# Patient Record
Sex: Male | Born: 1995
Health system: Southern US, Community
[De-identification: ages and names within clinical notes are randomized; demographics above are authoritative.]

## PROBLEM LIST (undated history)

## (undated) DIAGNOSIS — J45909 Unspecified asthma, uncomplicated: Secondary | ICD-10-CM

## (undated) HISTORY — PX: EAR TUBE REMOVAL: SHX1486

---
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
## 2017-02-12 DIAGNOSIS — R0781 Pleurodynia: Secondary | ICD-10-CM | POA: Diagnosis not present

## 2017-02-12 DIAGNOSIS — R6884 Jaw pain: Secondary | ICD-10-CM | POA: Diagnosis not present

## 2017-02-12 DIAGNOSIS — S0083XA Contusion of other part of head, initial encounter: Secondary | ICD-10-CM | POA: Diagnosis not present

## 2017-02-12 DIAGNOSIS — R0789 Other chest pain: Secondary | ICD-10-CM | POA: Diagnosis not present

## 2017-02-12 DIAGNOSIS — M546 Pain in thoracic spine: Secondary | ICD-10-CM | POA: Diagnosis not present

## 2017-06-15 ENCOUNTER — Telehealth: Payer: Self-pay | Admitting: Osteopathic Medicine

## 2017-06-15 DIAGNOSIS — Z0189 Encounter for other specified special examinations: Secondary | ICD-10-CM

## 2017-06-15 DIAGNOSIS — Z419 Encounter for procedure for purposes other than remedying health state, unspecified: Secondary | ICD-10-CM

## 2017-06-15 NOTE — Telephone Encounter (Signed)
Please call patient/dad [phone] I received form for clearance for school. For physical exam, I used the documentation from our annual physical back in January. However, the paperwork requires a vision and hearing screen, as well as a urine test and a blood test for anemia. He can go to the lab for urine and blood test, these orders are in. The paperwork also mentions immunization requirements listed on page 3 which I do not have available to review. I don't have any immunization records on file for him. Please ask patient to schedule an office visit with me and bring immunization records and full paperwork before I can sign off on this.

## 2017-06-19 NOTE — Telephone Encounter (Signed)
Informed pt's mother of recommendations. She asked that an appointment be made for pt to complete forms. Advised that they will need to bring in his immunization records so that we can complete his forms. She voiced understanding and I made the appt.Loralee PacasBarkley, Melvina Pangelinan JavaLynetta

## 2017-06-21 ENCOUNTER — Encounter: Payer: Self-pay | Admitting: Osteopathic Medicine

## 2017-06-21 ENCOUNTER — Ambulatory Visit (INDEPENDENT_AMBULATORY_CARE_PROVIDER_SITE_OTHER): Payer: 59 | Admitting: Osteopathic Medicine

## 2017-06-21 VITALS — BP 139/86 | HR 68 | Wt 186.0 lb

## 2017-06-21 DIAGNOSIS — Z020 Encounter for examination for admission to educational institution: Secondary | ICD-10-CM | POA: Diagnosis not present

## 2017-06-21 DIAGNOSIS — Z111 Encounter for screening for respiratory tuberculosis: Secondary | ICD-10-CM

## 2017-06-21 DIAGNOSIS — R2231 Localized swelling, mass and lump, right upper limb: Secondary | ICD-10-CM

## 2017-06-21 LAB — POCT HEMOGLOBIN: Hemoglobin: 14.7 g/dL (ref 14.1–18.1)

## 2017-06-21 LAB — POCT URINALYSIS DIPSTICK
BILIRUBIN UA: NEGATIVE
Blood, UA: NEGATIVE
Glucose, UA: NEGATIVE
KETONES UA: NEGATIVE
LEUKOCYTES UA: NEGATIVE
Nitrite, UA: NEGATIVE
PROTEIN UA: NEGATIVE
Spec Grav, UA: >=1.03 — AB (ref 1.010–1.025)
Urobilinogen, UA: 0.2 E.U./dL
pH, UA: 6.5 (ref 5.0–8.0)

## 2017-06-21 NOTE — Patient Instructions (Signed)
Plan:  Check TB test in 48 hours at Urgent Care  I will review all results and complete forms on Monday - will leave these up front for you to come pick up later in the week   If any concerns requiring further testing, we will call you  Any questions, please let us know!

## 2017-06-21 NOTE — Progress Notes (Signed)
HPI: Anthony Hurley is a 21 y.o. male  who presents to [facility] today, 06/21/17,  for chief complaint of:  Chief Complaint  Patient presents with  . Needs forms completed for school    Doing well. Needs forms completed for school including hearing/vision screen, review immunizations, Hgb level, PPD testing.   Concerned about small lump in R axilla present a few days  Past medical history, surgical history, social history and family history reviewed.  There are no active problems to display for this patient.  No family history on file.  Social History   Social History  . Marital status: Single    Spouse name: N/A  . Number of children: N/A  . Years of education: N/A   Occupational History  . Not on file.   Social History Main Topics  . Smoking status: Never Smoker  . Smokeless tobacco: Never Used  . Alcohol use No  . Drug use: No  . Sexual activity: Not on file   Other Topics Concern  . Not on file   Social History Narrative  . No narrative on file   Current medication list and allergy/intolerance information reviewed.   No current outpatient prescriptions on file prior to visit.   No current facility-administered medications on file prior to visit.    No Known Allergies    Review of Systems:  Constitutional: No recent illness  HEENT: No  headache, no vision change  Cardiac: No  chest pain, No  pressure, No palpitations  Respiratory:  No  shortness of breath. No  Cough  Gastrointestinal: No  abdominal pain, no change on bowel habits  Musculoskeletal: No new myalgia/arthralgia  Skin: No  Rash  Hem/Onc: No  easy bruising/bleeding, No  abnormal lumps/bumps  Neurologic: No  weakness, No  Dizziness  Psychiatric: No  concerns with depression, No  concerns with anxiety  Exam:  BP 139/86   Pulse 68   Wt 186 lb (84.4 kg)   SpO2 100%   BMI 30.02 kg/m   Constitutional: VS see above. General Appearance: alert,  well-developed, well-nourished, NAD  Eyes: Normal lids and conjunctive, non-icteric sclera  Ears, Nose, Mouth, Throat: MMM, Normal external inspection ears/nares/mouth/lips/gums.  Neck: No masses, trachea midline.   Respiratory: Normal respiratory effort. no wheeze, no rhonchi, no rales  Cardiovascular: S1/S2 normal, no murmur, no rub/gallop auscultated. RRR.   Musculoskeletal: Gait normal. Symmetric and independent movement of all extremities  Neurological: Normal balance/coordination. No tremor.  Skin: warm, dry, intact. Small LN vs cyst R axilla, nontender, no erythema  Psychiatric: Normal judgment/insight. Normal mood and affect. Oriented x3.   Results for orders placed or performed in visit on 06/21/17 (from the past 24 hour(s))  POCT urinalysis dipstick     Status: Abnormal   Collection Time: 06/21/17  2:35 PM  Result Value Ref Range   Color, UA yellow    Clarity, UA clear    Glucose, UA neg    Bilirubin, UA neg    Ketones, UA neg    Spec Grav, UA >=1.030 (A) 1.010 - 1.025   Blood, UA neg    pH, UA 6.5 5.0 - 8.0   Protein, UA neg    Urobilinogen, UA 0.2 0.2 or 1.0 E.U./dL   Nitrite, UA neg    Leukocytes, UA Negative Negative  POCT hemoglobin     Status: None   Collection Time: 06/21/17  2:35 PM  Result Value Ref Range   Hemoglobin 14.7 14.1 - 18.1  g/dL      ASSESSMENT/PLAN:   School physical exam - No concerns on available labs. Immunizations UTD. PPD pending. Hgb and UA pending.  - Plan: POCT urinalysis dipstick, POCT hemoglobin  Mass of right axilla - small pea-szed mobile subq palpable, c/w LN vs cyst, watchful waiting  Encounter for PPD test - Plan: PPD    Patient Instructions  Plan:  Check TB test in 48 hours at Urgent Care  I will review all results and complete forms on Monday - will leave these up front for you to come pick up later in the week   If any concerns requiring further testing, we will call you  Any questions, please let us know!      Follow-up plan: Return in about 2 days (around 06/23/2017) for TB test interpretation by nurse at urgent care next door .  Visit summary with medication list and pertinent instructions was printed for patient to review, alert us if any changes needed. All questions at time of visit were answered - patient instructed to contact office with any additional concerns. ER/RTC precautions were reviewed with the patient and understanding verbalized.   Total time spent 25 minutes, greater than 50% of the visit was face to face counseling and coordinating care for diagnosis of The primary encounter diagnosis was School physical exam. Diagnoses of Mass of right axilla and Encounter for PPD test were also pertinent to this visit.

## 2017-06-23 ENCOUNTER — Emergency Department (INDEPENDENT_AMBULATORY_CARE_PROVIDER_SITE_OTHER)
Admission: EM | Admit: 2017-06-23 | Discharge: 2017-06-23 | Disposition: A | Discharge status: Home / Self Care | Payer: 59 | Source: Home / Self Care

## 2017-06-23 DIAGNOSIS — Z111 Encounter for screening for respiratory tuberculosis: Secondary | ICD-10-CM

## 2017-06-23 DIAGNOSIS — Z020 Encounter for examination for admission to educational institution: Secondary | ICD-10-CM | POA: Diagnosis not present

## 2017-06-23 NOTE — ED Triage Notes (Signed)
Pt here for ppd reading. Had placed in left forearm on 06/21/17 at primary care. negative ppd. No induration.

## 2017-06-29 ENCOUNTER — Telehealth: Payer: Self-pay | Admitting: Osteopathic Medicine

## 2017-06-29 NOTE — Telephone Encounter (Signed)
Patient's dad advised.  

## 2017-06-29 NOTE — Telephone Encounter (Signed)
School forms signed, placed in assistant's in basket. Sticking out on here says called dad when complete, [name] [phone]. Thanks.

## 2017-08-20 ENCOUNTER — Ambulatory Visit (INDEPENDENT_AMBULATORY_CARE_PROVIDER_SITE_OTHER): Payer: 59 | Admitting: Osteopathic Medicine

## 2017-08-20 ENCOUNTER — Encounter: Payer: Self-pay | Admitting: Osteopathic Medicine

## 2017-08-20 VITALS — BP 129/77 | HR 62 | Temp 98.7°F | Ht 66.0 in | Wt 181.0 lb

## 2017-08-20 DIAGNOSIS — R002 Palpitations: Secondary | ICD-10-CM | POA: Diagnosis not present

## 2017-08-20 DIAGNOSIS — S29012A Strain of muscle and tendon of back wall of thorax, initial encounter: Secondary | ICD-10-CM | POA: Diagnosis not present

## 2017-08-20 DIAGNOSIS — Z23 Encounter for immunization: Secondary | ICD-10-CM

## 2017-08-20 MED ORDER — METHYLPREDNISOLONE 4 MG PO TBPK: ORAL_TABLET | ORAL | 0 refills | Status: DC

## 2017-08-20 MED ORDER — CYCLOBENZAPRINE HCL 10 MG PO TABS: 5.0000 mg | ORAL_TABLET | Freq: Three times a day (TID) | ORAL | 1 refills | Status: DC | PRN

## 2017-08-20 MED ORDER — NAPROXEN 500 MG PO TABS: 500.0000 mg | ORAL_TABLET | Freq: Two times a day (BID) | ORAL | 1 refills | Status: DC

## 2017-08-20 MED FILL — CYCLOBENZAPRINE 10 MG TAB: 10 | 10 days supply | Qty: 30 | Fill #0

## 2017-08-20 MED FILL — NAPROXEN 500 MG TABLET: 500 | 15 days supply | Qty: 30 | Fill #0

## 2017-08-20 MED FILL — METHYLPREDNISOLONE 4 MG TAB: 4 | 6 days supply | Qty: 21 | Fill #0

## 2017-08-20 NOTE — Progress Notes (Signed)
HPI: Anthony Hurley is a 21 y.o. male  who presents to [facility] today, 08/20/17,  for chief complaint of:  Chief Complaint  Patient presents with  . Back Pain   . Context: no injury or repetitive strain injury he can think of. Works at [employer], no heavy lifting.  . Location: under shoulder blade . Quality: sore, sharp . Severity: 10 yesterday, now 5 or 6 . Duration: started yesterday . Timing: constant at this point  . Modifying factors: no meds/remedies tried. Worse with twisting and hurts to the touch . Assoc signs/symptoms: No chest pain but reports occasional thumping heartbeat/palpitation without SOB/LOC, reports at time of onset he was feeling an abdominal/stomach cramp (epigastric area) which resolved.   Patient is accompanied by dad who assists with history-taking.   Past medical, surgical, social and family history reviewed: There are no active problems to display for this patient.  Past Surgical History:  Procedure Laterality Date  . EAR TUBE REMOVAL     Social History  Substance Use Topics  . Smoking status: Never Smoker  . Smokeless tobacco: Never Used  . Alcohol use No   No family history on file.   Current medication list and allergy/intolerance information reviewed:   No current outpatient prescriptions on file.   No current facility-administered medications for this visit.    No Known Allergies    Review of Systems:  Constitutional:  No  fever, no chills, No recent illness, No unintentional weight changes. No significant fatigue.   HEENT: No  headache, no vision change  Cardiac: No  chest pain, No  pressure, No palpitations  Respiratory:  No  shortness of breath. No  Cough  Gastrointestinal: No  abdominal pain, No  nausea, No  vomiting,  No  blood in stool, No  diarrhea, No  constipation   Musculoskeletal: No new myalgia/arthralgia  Hem/Onc: No  easy bruising/bleeding  Endocrine: No cold intolerance,   No heat intolerance.  Neurologic: No  weakness, No  dizziness  Psychiatric: No  concerns with depression, No  concerns with anxiety  Exam:  BP 129/77   Pulse 62   Temp 98.7 F (37.1 C) (Oral)   Ht  (1.676 m)   Wt 181 lb (82.1 kg)   BMI 29.21 kg/m   Constitutional: VS see above. General Appearance: alert, well-developed, well-nourished, NAD  Eyes: Normal lids and conjunctive, non-icteric sclera  Ears, Nose, Mouth, Throat: MMM, Normal external inspection ears/nares/mouth/lips/gums.  Neck: No masses, trachea midline. No thyroid enlargement.   Respiratory: Normal respiratory effort. no wheeze, no rhonchi, no rales  Cardiovascular: S1/S2 normal, no murmur, no rub/gallop auscultated. RRR. No lower extremity edema.   Gastrointestinal: Nontender, no masses.   Musculoskeletal: Gait normal. No clubbing/cyanosis of digits. (+)tenderness and spasm in L rhomboid   Neurological: Normal balance/coordination. No tremor.  Skin: warm, dry, intact. No rash/ulcer.   Psychiatric: Normal judgment/insight. Normal mood and affect. Oriented x3.    EKG interpretation: Rate: 55 Rhythm: sinus No ST/T changes concerning for acute ischemia/infarct     ASSESSMENT/PLAN:   Strain of rhomboid muscle, initial encounter - OMT provided to some relief - FPR, MFR. Home exercises given. Fil steroids if daily naproxen and prn cyclobenzaprine not helping, consder PT or imaging - Plan: naproxen (NAPROSYN) 500 MG tablet, cyclobenzaprine (FLEXERIL) 10 MG tablet, methylPREDNISolone (MEDROL DOSEPAK) 4 MG TBPK tablet, DISCONTINUED: methylPREDNISolone (MEDROL DOSEPAK) 4 MG TBPK tablet  Intermittent palpitations - avoid caffeine, declines holter/echo at this point given rare  symptoms, advised let m eknow if further w/u desired, ER precautions reviewed   Need for immunization against influenza - Plan: Flu Vaccine QUAD 36+ mos IM    Visit summary with medication list and pertinent instructions was printed  for patient to review. All questions at time of visit were answered - patient instructed to contact office with any additional concerns. ER/RTC precautions were reviewed with the patient. Follow-up plan: Return if symptoms worsen call ASAP, or is symptoms fail to improve within 1-2 weeks.

## 2017-09-05 ENCOUNTER — Telehealth: Payer: Self-pay | Admitting: Osteopathic Medicine

## 2017-09-05 NOTE — Telephone Encounter (Signed)
Anthony Hurley called and he is questioning bill for visit on 7/19.  Pt was scheduled for a physical on 7/19 and was charged $200 + and he is needing an explanation of this because usually physicals are paid for by insurance.  Thanks

## 2017-09-05 NOTE — Telephone Encounter (Signed)
Patient had already had a physical this year. He came to see me for extra forms needing filled out for school. I don't have  aproblem coding this as preventive care if you think it meets criteria for that, but I guess but insurance may not cover it. We also addressed a problem in this visit.

## 2017-09-06 NOTE — Telephone Encounter (Signed)
You can leave everything as is Dr A. Insurances were not in the proper order in computer and this has been corrected.

## 2017-09-06 NOTE — Addendum Note (Signed)
Addended by: Collie Siad on: 09/06/2017 02:46 PM   Modules accepted: Orders

## 2018-07-29 DIAGNOSIS — R51 Headache: Secondary | ICD-10-CM | POA: Diagnosis not present

## 2018-07-29 DIAGNOSIS — H52223 Regular astigmatism, bilateral: Secondary | ICD-10-CM | POA: Diagnosis not present

## 2018-07-29 DIAGNOSIS — H5213 Myopia, bilateral: Secondary | ICD-10-CM | POA: Diagnosis not present

## 2019-04-01 ENCOUNTER — Emergency Department (INDEPENDENT_AMBULATORY_CARE_PROVIDER_SITE_OTHER): Payer: 59

## 2019-04-01 ENCOUNTER — Emergency Department (INDEPENDENT_AMBULATORY_CARE_PROVIDER_SITE_OTHER)
Admission: EM | Admit: 2019-04-01 | Discharge: 2019-04-01 | Disposition: A | Discharge status: Home / Self Care | Payer: 59 | Source: Home / Self Care

## 2019-04-01 ENCOUNTER — Other Ambulatory Visit: Payer: Self-pay

## 2019-04-01 ENCOUNTER — Encounter: Payer: Self-pay | Admitting: Family Medicine

## 2019-04-01 DIAGNOSIS — M25532 Pain in left wrist: Secondary | ICD-10-CM | POA: Diagnosis not present

## 2019-04-01 DIAGNOSIS — S63502A Unspecified sprain of left wrist, initial encounter: Secondary | ICD-10-CM

## 2019-04-01 DIAGNOSIS — S6992XA Unspecified injury of left wrist, hand and finger(s), initial encounter: Secondary | ICD-10-CM | POA: Diagnosis not present

## 2019-04-01 NOTE — ED Triage Notes (Signed)
Fell yesterday landing on LT wrist

## 2019-04-01 NOTE — ED Provider Notes (Signed)
[facility]    CSN: [id number] Arrival date & time: 04/01/19  1313     History   Chief Complaint Chief Complaint  Patient presents with  . Wrist Injury    HPI Anthony Hurley is a 23 y.o. male.   23 year old man presents with left arm pain.  He is an established patient here at [facility] urgent care.  Patient works at [employer].   He fell on the wrist yesterday and rolled on the ground.  Left wrist pain since over ulnar styloid.  Pain with supination as well.  Mild swelling     Past Medical History:  Diagnosis Date  . Asthma     Patient Active Problem List   Diagnosis Date Noted  . Strain of rhomboid muscle 08/20/2017  . Intermittent palpitations 08/20/2017    Past Surgical History:  Procedure Laterality Date  . EAR TUBE REMOVAL         Home Medications    Prior to Admission medications   Not on File    Family History Family History  Problem Relation Age of Onset  . Healthy Mother   . Healthy Father     Social History Social History   Tobacco Use  . Smoking status: Never Smoker  . Smokeless tobacco: Never Used  Substance Use Topics  . Alcohol use: No  . Drug use: No     Allergies   Patient has no known allergies.   Review of Systems Review of Systems  Musculoskeletal: Positive for joint swelling.  All other systems reviewed and are negative.    Physical Exam Triage Vital Signs ED Triage Vitals  Enc Vitals Group     BP      Pulse      Resp      Temp      Temp src      SpO2      Weight      Height      Head Circumference      Peak Flow      Pain Score      Pain Loc      Pain Edu?      Excl. in GC?    No data found.  Updated Vital Signs BP 132/80   Pulse (!) 57   Temp 98.6 F (37 C) (Oral)   Ht 5\' 6"  (1.676 m)   Wt 90.7 kg   BMI 32.28 kg/m    Physical Exam Vitals signs and nursing note reviewed.  Constitutional:      Appearance: Normal appearance. He is normal weight.  HENT:   Mouth/Throat:     Mouth: Mucous membranes are moist.  Eyes:     Conjunctiva/sclera: Conjunctivae normal.  Neck:     Musculoskeletal: Normal range of motion and neck supple.  Pulmonary:     Effort: Pulmonary effort is normal.  Musculoskeletal:        General: Swelling, tenderness and signs of injury present. No deformity.     Comments: Left wrist pain over ulnar styloid.  Pain with supination as well.  Mild swelling over distal ulna  Skin:    General: Skin is warm and dry.  Neurological:     General: No focal deficit present.     Mental Status: He is alert and oriented to person, place, and time.  Psychiatric:        Mood and Affect: Mood normal.        Behavior: Behavior normal.  Thought Content: Thought content normal.        Judgment: Judgment normal.      UC Treatments / Results  Labs (all labs ordered are listed, but only abnormal results are displayed) Labs Reviewed - No data to display  EKG None  Radiology No results found.  Procedures Procedures (including critical care time)  Medications Ordered in UC Medications - No data to display  Initial Impression / Assessment and Plan / UC Course  I have reviewed the triage vital signs and the nursing notes.  Pertinent labs & imaging results that were available during my care of the patient were reviewed by me and considered in my medical decision making (see chart for details).    Final Clinical Impressions(s) / UC Diagnoses   Final diagnoses:  Sprain of left wrist, initial encounter     Discharge Instructions     The wrist is sprained and should heal over the next 10 days.  Wear the splint when working. If pain persists, please return for a recheck of the wrist. Ibuprofen for a couple days may help    ED Prescriptions    None     Controlled Substance Prescriptions [place] Controlled Substance Registry consulted? Not Applicable   Elvina SidleLauenstein, Cecilia Vancleve, MD 04/01/19 1401

## 2019-04-01 NOTE — Discharge Instructions (Addendum)
The wrist is sprained and should heal over the next 10 days.  Wear the splint when working. If pain persists, please return for a recheck of the wrist. Ibuprofen for a couple days may help

## 2019-10-09 DIAGNOSIS — R519 Headache, unspecified: Secondary | ICD-10-CM | POA: Diagnosis not present

## 2019-10-09 DIAGNOSIS — H52223 Regular astigmatism, bilateral: Secondary | ICD-10-CM | POA: Diagnosis not present

## 2019-11-26 DIAGNOSIS — Z20828 Contact with and (suspected) exposure to other viral communicable diseases: Secondary | ICD-10-CM | POA: Diagnosis not present

## 2019-12-03 IMAGING — DX LEFT WRIST - COMPLETE 3+ VIEW
4 series · 4 of 4 positions shown · non-contrast
Comparison: None.

CLINICAL DATA: Fall onto left wrist yesterday

EXAM:
LEFT WRIST - COMPLETE 3+ VIEW

[wrist pa]
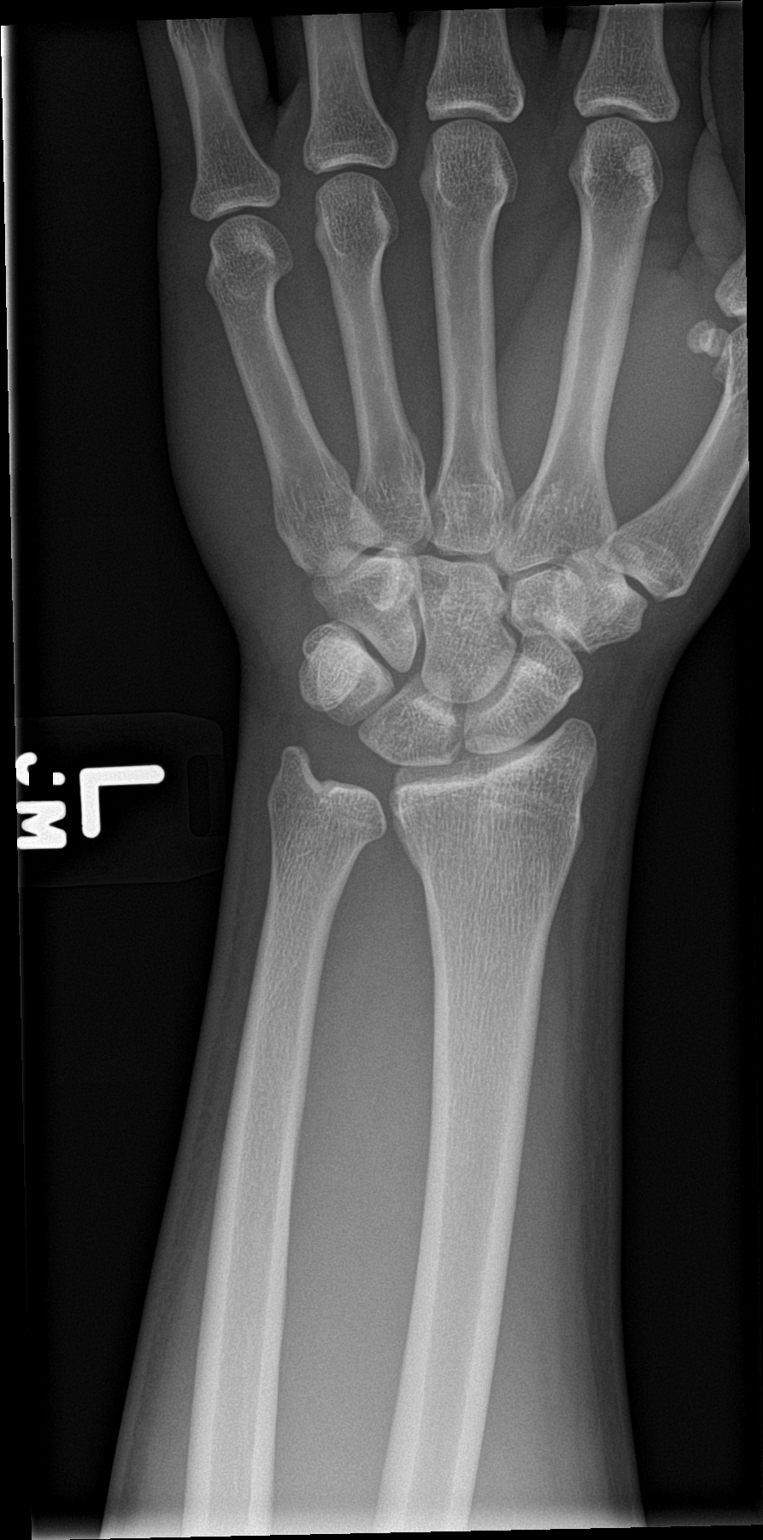

[wrist obl]
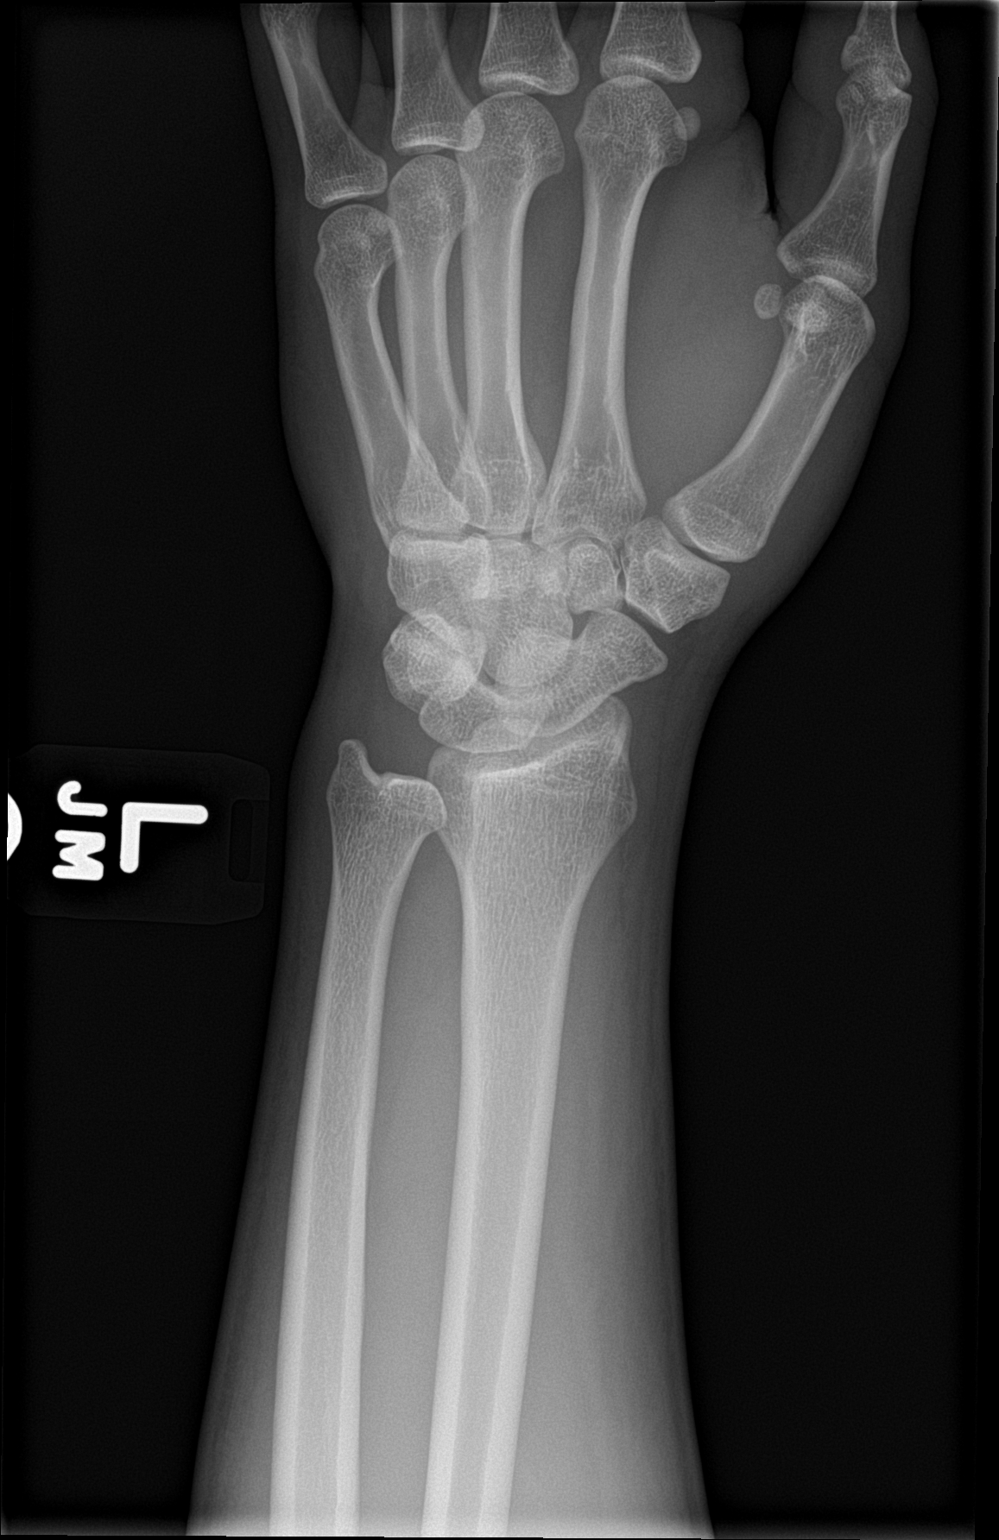

[wrist lat]
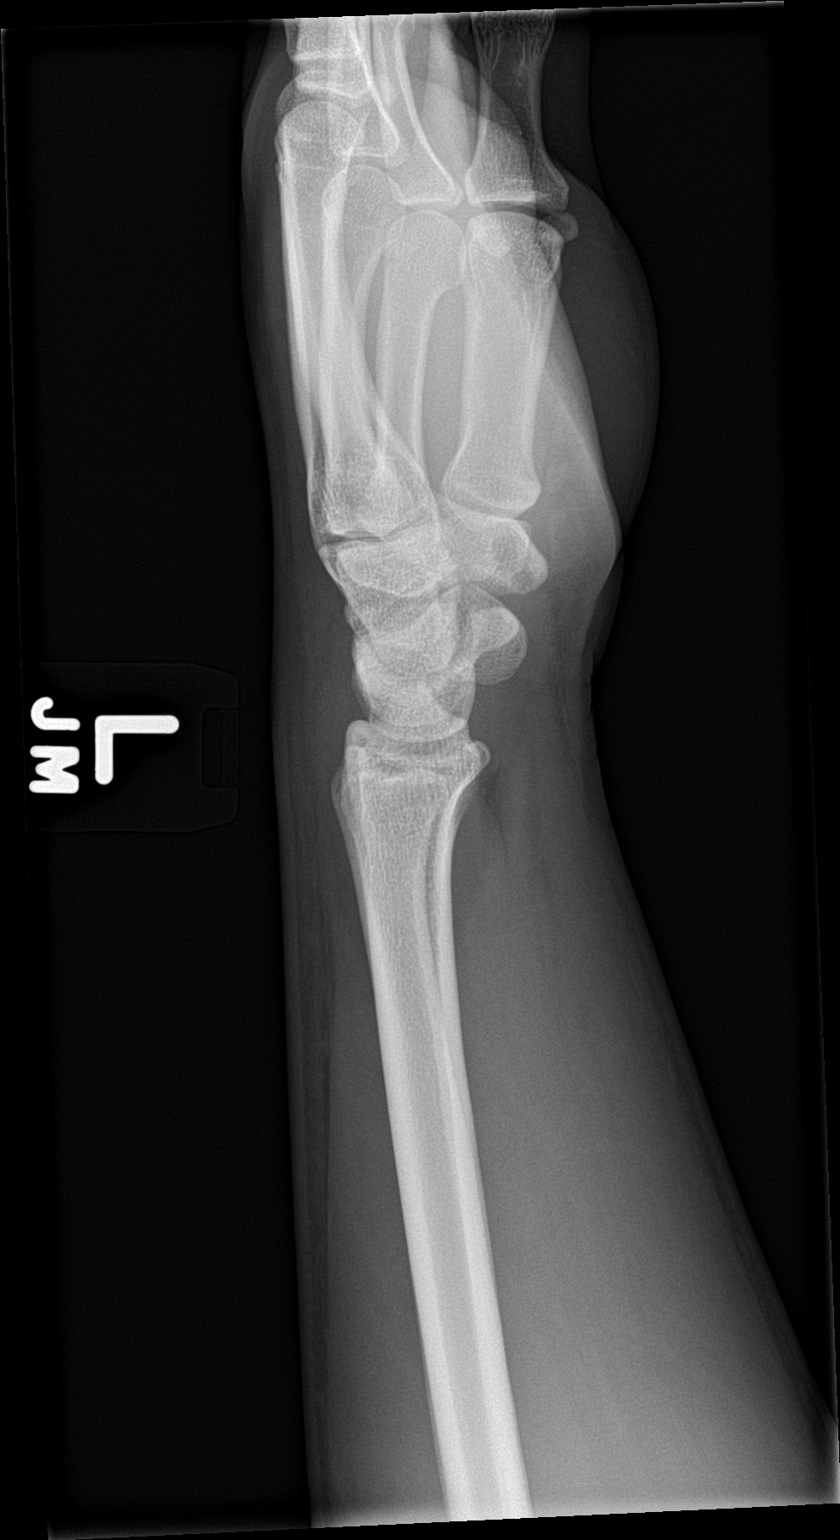

[wrist navicular]
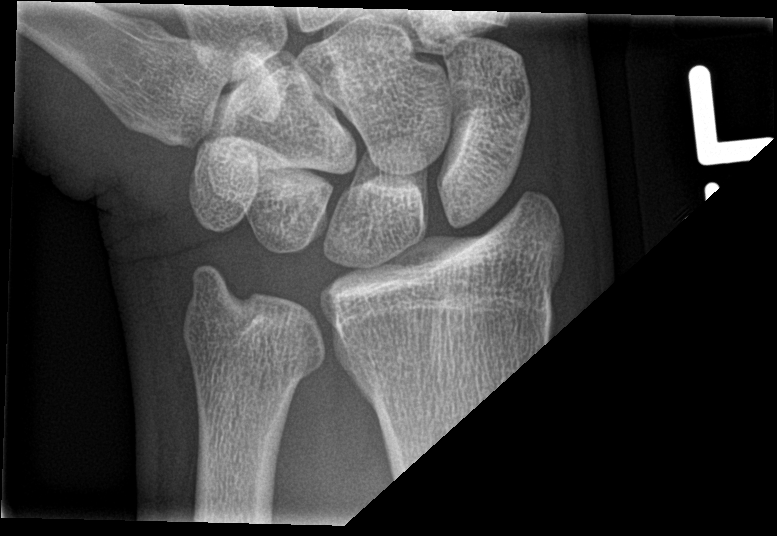

[4 of 4 positions shown; findings below may reference images not displayed]

FINDINGS: There is no evidence of fracture or dislocation. There is no
evidence of arthropathy or other focal bone abnormality. Soft
tissues are unremarkable.
IMPRESSION: Negative.

## 2020-01-09 ENCOUNTER — Ambulatory Visit (INDEPENDENT_AMBULATORY_CARE_PROVIDER_SITE_OTHER): Payer: BLUE CROSS/BLUE SHIELD | Admitting: Osteopathic Medicine

## 2020-01-09 ENCOUNTER — Other Ambulatory Visit: Payer: Self-pay

## 2020-01-09 ENCOUNTER — Encounter: Payer: Self-pay | Admitting: Osteopathic Medicine

## 2020-01-09 VITALS — BP 138/85 | HR 71 | Temp 98.3°F | Wt 203.8 lb

## 2020-01-09 DIAGNOSIS — Z0000 Encounter for general adult medical examination without abnormal findings: Secondary | ICD-10-CM | POA: Diagnosis not present

## 2020-01-09 DIAGNOSIS — Z23 Encounter for immunization: Secondary | ICD-10-CM | POA: Diagnosis not present

## 2020-01-09 DIAGNOSIS — R42 Dizziness and giddiness: Secondary | ICD-10-CM

## 2020-01-09 NOTE — Progress Notes (Signed)
HPI: Anthony Hurley is a 24 y.o. male who  has a past medical history of Asthma.  he presents to [facility] today, 01/09/20,  for chief complaint of: Annual physical Needs work note    Patient here for annual physical / wellness exam.  See preventive care reviewed as below.  Recent labs reviewed in detail with the patient.   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 Strain of rhomboid muscle 08/20/2017  . Intermittent palpitations 08/20/2017    Past Surgical History:  Procedure Laterality Date  . EAR TUBE REMOVAL      Social History   Tobacco Use  . Smoking status: Never Smoker  . Smokeless tobacco: Never Used  Substance Use Topics  . Alcohol use: No    Family History  Problem Relation Age of Onset  . Healthy Mother   . Healthy Father      Current medication list and allergy/intolerance information reviewed:    No current outpatient medications on file.   No current facility-administered medications for this visit.    No Known Allergies    Review of Systems:  Constitutional:  No  fever, no chills, No recent illness  HEENT: No  headache, no vision change  Cardiac: No  chest pain  Respiratory:  No  shortness of breath. No  Cough  Gastrointestinal: No  abdominal pain, +Nausea/vomiting w/ heights,  No  blood in stool, No  diarrhea, No  constipation   Musculoskeletal: No new myalgia/arthralgia  Skin: No  Rash  Hem/Onc: No  easy bruising/bleeding, No  abnormal lymph node  Neurologic: No  weakness, +dizziness per HPI  Psychiatric: No  concerns with depression, No  concerns with anxiety, No sleep problems, No mood problems  Exam:  BP 138/85 (BP Location: Left Arm, Patient Position: Sitting, Cuff Size: Normal)    Pulse 71   Temp 98.3 F (36.8 C) (Oral)   Wt 203 lb 12.8 oz (92.4 kg)   BMI 32.89 kg/m   Constitutional: VS see above. General Appearance: alert, well-developed, well-nourished, NAD  Eyes: Normal lids and conjunctive, non-icteric sclera  Neck: No masses, trachea midline. No thyroid enlargement. No tenderness/mass appreciated. No lymphadenopathy  Respiratory: Normal respiratory effort. no wheeze, no rhonchi, no rales  Cardiovascular: S1/S2 normal, no murmur, no rub/gallop auscultated. RRR. No lower extremity edema.   Gastrointestinal: Nontender, no masses. No hepatomegaly, no splenomegaly. No hernia appreciated. Bowel sounds normal. Rectal exam deferred.   Musculoskeletal: Gait normal. No clubbing/cyanosis of digits.   Neurological: Normal balance/coordination. No tremor. No cranial nerve deficit on limited exam. Motor and sensation intact and symmetric. Cerebellar reflexes intact.   Skin: warm, dry, intact. No rash/ulcer. No concerning nevi or subq nodules on limited exam.    Psychiatric: Normal judgment/insight. Normal mood and affect. Oriented x3.    No results found for this or any previous visit (from the past 72 hour(s)).  No results found.          ASSESSMENT/PLAN: The primary encounter diagnosis was Annual physical exam. Diagnoses of Need for Tdap vaccination, Need for influenza vaccination, and Vertigo were also pertinent to this visit.   Orders Placed This Encounter  Procedures  . Tdap vaccine greater than or equal to 7yo IM  . Flu Vaccine QUAD 6+ mos PF IM (  Fluarix Quad PF)  . CBC  . COMPLETE METABOLIC PANEL WITH GFR  . Lipid panel    No orders of the defined types were placed in this encounter.   Patient Instructions  General Preventive Care  Most recent routine screening lipids/other labs:baseline labs ordered to get done sometime next few weeks!   Everyone should have blood pressure checked once per year.   Tobacco: don't! Alcohol: responsible  moderation is ok for most adults - if you have concerns about your alcohol intake, please talk to me!   Exercise: as tolerated to reduce risk of cardiovascular disease and diabetes. Strength training will also prevent osteoporosis.   Mental health: if need for mental health care (medicines, counseling, other), or concerns about moods, please let me know!   Sexual health: if need for STD testing, or if concerns with libido/pain problems, please let me know!   Advanced Directive: Living Will and/or Healthcare Power of Attorney recommended for all adults, regardless of age or health.  Vaccines  Flu vaccine: recommended for almost everyone, every fall.   Shingles vaccine: Shingrix recommended after age 14.   Pneumonia vaccines: Prevnar and Pneumovax recommended after age 88  Tetanus booster: Tdap recommended every 10 years. Due 2031! Cancer screenings   Colon cancer screening: recommended for everyone at age 18  Prostate cancer screening: PSA blood test around age 43  Lung cancer screening: not needed for non-smokers  Infection screenings . HIV: recommended screening at least once age 70-65, more often as needed. . Gonorrhea/Chlamydia: screening as needed, . Hepatitis C: recommended for anyone born 21-1963 . TB: certain at-risk populations, or depending on work requirements and/or travel history Other . Bone Density Test: recommended for men at age 47, sooner depending on risk factors . Abdominal Aortic Aneurysm: screening with ultrasound recommended once for men age 6-75 who have ever smoked         Visit summary with medication list and pertinent instructions was printed for patient to review. All questions at time of visit were answered - patient instructed to contact office with any additional concerns or updates. ER/RTC precautions were reviewed with the patient.    Please note: voice recognition software was used to produce this document, and typos may escape review.  Please contact Dr. Lyn Hollingshead for any needed clarifications.     Follow-up plan: Return in about 1 year (around 01/08/2021) for [employer], SEE ME SOONER IF NEEDED .

## 2020-01-09 NOTE — Patient Instructions (Addendum)
General Preventive Care  Most recent routine screening lipids/other labs:baseline labs ordered to get done sometime next few weeks!   Everyone should have blood pressure checked once per year.   Tobacco: don't! Alcohol: responsible moderation is ok for most adults - if you have concerns about your alcohol intake, please talk to me!   Exercise: as tolerated to reduce risk of cardiovascular disease and diabetes. Strength training will also prevent osteoporosis.   Mental health: if need for mental health care (medicines, counseling, other), or concerns about moods, please let me know!   Sexual health: if need for STD testing, or if concerns with libido/pain problems, please let me know!   Advanced Directive: Living Will and/or Healthcare Power of Attorney recommended for all adults, regardless of age or health.  Vaccines  Flu vaccine: recommended for almost everyone, every fall.   Shingles vaccine: Shingrix recommended after age 3.   Pneumonia vaccines: Prevnar and Pneumovax recommended after age 65  Tetanus booster: Tdap recommended every 10 years. Due 2031! Cancer screenings   Colon cancer screening: recommended for everyone at age 57  Prostate cancer screening: PSA blood test around age 52  Lung cancer screening: not needed for non-smokers  Infection screenings . HIV: recommended screening at least once age 23-65, more often as needed. . Gonorrhea/Chlamydia: screening as needed, . Hepatitis C: recommended for anyone born 73-1963 . TB: certain at-risk populations, or depending on work requirements and/or travel history Other . Bone Density Test: recommended for men at age 86, sooner depending on risk factors . Abdominal Aortic Aneurysm: screening with ultrasound recommended once for men age 84-75 who have ever smoked

## 2020-01-10 LAB — COMPLETE METABOLIC PANEL WITH GFR
AG Ratio: 1.5 (calc) (ref 1.0–2.5)
ALT: 47 U/L — ABNORMAL HIGH (ref 9–46)
AST: 25 U/L (ref 10–40)
Albumin: 4.6 g/dL (ref 3.6–5.1)
Alkaline phosphatase (APISO): 76 U/L (ref 36–130)
BUN: 16 mg/dL (ref 7–25)
CO2: 24 mmol/L (ref 20–32)
Calcium: 10.3 mg/dL (ref 8.6–10.3)
Chloride: 103 mmol/L (ref 98–110)
Creat: 0.9 mg/dL (ref 0.60–1.35)
GFR, Est African American: 141 mL/min/{1.73_m2} (ref >=60)
GFR, Est Non African American: 122 mL/min/{1.73_m2} (ref >=60)
Globulin: 3 g/dL (calc) (ref 1.9–3.7)
Glucose, Bld: 96 mg/dL (ref 65–99)
Potassium: 4.3 mmol/L (ref 3.5–5.3)
Sodium: 139 mmol/L (ref 135–146)
Total Bilirubin: 0.5 mg/dL (ref 0.2–1.2)
Total Protein: 7.6 g/dL (ref 6.1–8.1)

## 2020-01-10 LAB — CBC
HCT: 46.6 % (ref 38.5–50.0)
Hemoglobin: 16 g/dL (ref 13.2–17.1)
MCH: 30.2 pg (ref 27.0–33.0)
MCHC: 34.3 g/dL (ref 32.0–36.0)
MCV: 87.9 fL (ref 80.0–100.0)
MPV: 10.5 fL (ref 7.5–12.5)
Platelets: 224 10*3/uL (ref 140–400)
RBC: 5.3 10*6/uL (ref 4.20–5.80)
RDW: 12.9 % (ref 11.0–15.0)
WBC: 6.2 10*3/uL (ref 3.8–10.8)

## 2020-01-10 LAB — LIPID PANEL
Cholesterol: 240 mg/dL — ABNORMAL HIGH (ref <200)
HDL: 47 mg/dL (ref >=40)
LDL Cholesterol (Calc): 174 mg/dL (calc) — ABNORMAL HIGH
Non-HDL Cholesterol (Calc): 193 mg/dL (calc) — ABNORMAL HIGH (ref <130)
Total CHOL/HDL Ratio: 5.1 (calc) — ABNORMAL HIGH (ref <5.0)
Triglycerides: 82 mg/dL (ref <150)

## 2020-04-10 DIAGNOSIS — Z20828 Contact with and (suspected) exposure to other viral communicable diseases: Secondary | ICD-10-CM | POA: Diagnosis not present
